# Patient Record
Sex: Male | Born: 1960 | Race: White | Hispanic: No | Smoking: Never smoker
Health system: Southern US, Community
[De-identification: ages and names within clinical notes are randomized; demographics above are authoritative.]

---

## 1997-09-23 ENCOUNTER — Ambulatory Visit (HOSPITAL_COMMUNITY)
Admission: EM | Admit: 1997-09-23 | Discharge: 1997-09-23 | Payer: Self-pay | Admitting: Physical Medicine & Rehabilitation

## 1997-10-14 ENCOUNTER — Ambulatory Visit (HOSPITAL_COMMUNITY)
Admission: RE | Admit: 1997-10-14 | Discharge: 1997-10-14 | Payer: Self-pay | Admitting: Physical Medicine & Rehabilitation

## 1997-10-28 ENCOUNTER — Ambulatory Visit (HOSPITAL_COMMUNITY)
Admission: RE | Admit: 1997-10-28 | Discharge: 1997-10-28 | Payer: Self-pay | Admitting: Physical Medicine & Rehabilitation

## 2003-01-21 ENCOUNTER — Encounter: Payer: Self-pay | Admitting: Specialist

## 2003-01-23 ENCOUNTER — Encounter: Payer: Self-pay | Admitting: Specialist

## 2003-01-23 ENCOUNTER — Ambulatory Visit (HOSPITAL_COMMUNITY): Admission: RE | Admit: 2003-01-23 | Discharge: 2003-01-24 | Payer: Self-pay | Admitting: Specialist

## 2006-02-09 ENCOUNTER — Inpatient Hospital Stay (HOSPITAL_COMMUNITY): Admission: RE | Admit: 2006-02-09 | Discharge: 2006-02-12 | Payer: Self-pay | Admitting: Neurosurgery

## 2009-01-22 ENCOUNTER — Observation Stay (HOSPITAL_COMMUNITY): Admission: EM | Admit: 2009-01-22 | Discharge: 2009-01-23 | Payer: Self-pay | Admitting: Emergency Medicine

## 2010-06-21 ENCOUNTER — Observation Stay (HOSPITAL_COMMUNITY)
Admission: EM | Admit: 2010-06-21 | Discharge: 2010-06-22 | Payer: Self-pay | Source: Home / Self Care | Attending: Internal Medicine | Admitting: Internal Medicine

## 2010-08-30 LAB — LIPID PANEL
Triglycerides: 69 mg/dL (ref <150)
VLDL: 14 mg/dL (ref 0–40)

## 2010-08-30 LAB — CK TOTAL AND CKMB (NOT AT ARMC)
Relative Index: 1.8 (ref 0.0–2.5)
Total CK: 155 U/L (ref 7–232)

## 2010-08-30 LAB — CARDIAC PANEL(CRET KIN+CKTOT+MB+TROPI)
CK, MB: 2 ng/mL (ref 0.3–4.0)
Relative Index: INVALID (ref 0.0–2.5)
Total CK: 109 U/L (ref 7–232)
Troponin I: 0.02 ng/mL (ref 0.00–0.06)

## 2010-08-30 LAB — BASIC METABOLIC PANEL
CO2: 25 mEq/L (ref 19–32)
Calcium: 9 mg/dL (ref 8.4–10.5)
Creatinine, Ser: 0.78 mg/dL (ref 0.4–1.5)
Glucose, Bld: 103 mg/dL — ABNORMAL HIGH (ref 70–99)

## 2010-08-30 LAB — POCT I-STAT, CHEM 8
BUN: 15 mg/dL (ref 6–23)
Calcium, Ion: 1.07 mmol/L — ABNORMAL LOW (ref 1.12–1.32)
Chloride: 103 mEq/L (ref 96–112)
Glucose, Bld: 122 mg/dL — ABNORMAL HIGH (ref 70–99)

## 2010-08-30 LAB — TROPONIN I: Troponin I: 0.02 ng/mL (ref 0.00–0.06)

## 2010-08-30 LAB — POCT CARDIAC MARKERS: Troponin i, poc: 0.09 ng/mL (ref 0.00–0.09)

## 2010-11-05 NOTE — Discharge Summary (Signed)
NAME:  Troy Herrera, Troy Herrera            ACCOUNT NO.:  [id number]   MEDICAL RECORD NO.:  [id number]          PATIENT TYPE:  INP   LOCATION:  3033                         FACILITY:  [facility]   PHYSICIAN:  Reinaldo Meeker, M.D. DATE OF BIRTH:  01/22/1961   DATE OF ADMISSION:  02/09/2006  DATE OF DISCHARGE:  02/12/2006                               DISCHARGE SUMMARY   PRIMARY DIAGNOSIS:  1. Degenerative disk disease.  2. Herniated disk, L4-5, L5-S1.   PRIMARY OPERATIVE PROCEDURE:  L4-5, L5-S1 PLIF with pedicle screw  fixation.   HISTORY:  Mr. Aikey is a 50 year old gentleman with back and  bilateral lower extremity pain.  MRI scan and diskogram showed marked  degenerative disk disease with herniated disk at L4-5 and L5-S1.  He is  now admitted for a 2-level PLIF with screw fixation.  He underwent the  above-mentioned procedure on August 23rd and tolerated well.  By August  24th, he was slowly increasing his activity.  It was hoped that he might  be able to go home on August 25th, but he was having some spasms and  needed some medication.  By the next day, he was feeling better.  He was  discharged home.   DISCHARGE MEDICATIONS:  Include pain medication, muscle relaxants.   His condition was markedly improved versus admission.           ______________________________  Reinaldo Meeker, M.D.     ROK/MEDQ  D:  05/25/2006  T:  05/26/2006  Job:  [id number]

## 2010-11-05 NOTE — Op Note (Signed)
NAME:  Troy Herrera, Troy Herrera            ACCOUNT NO.:  [id number]   MEDICAL RECORD NO.:  [id number]          PATIENT TYPE:  INP   LOCATION:  3033                         FACILITY:  [facility]   PHYSICIAN:  Reinaldo Meeker, M.D. DATE OF BIRTH:  May 18, 1961   DATE OF PROCEDURE:  02/09/2006  DATE OF DISCHARGE:                                 OPERATIVE REPORT   PREOPERATIVE DIAGNOSIS:  Herniated disk and degenerative disk disease of L4-  5, L5-S1.   POSTOPERATIVE DIAGNOSIS:  Herniated disk and degenerative disk disease of L4-  5, L5-S1.   PROCEDURE:  L4-5, L5-S1 decompressive laminectomy followed by bilateral L4-5  and L5-S1 microdiskectomy, followed by L4-5 and L5-S1 posterior lumbar  interbody fusion with NuVasive bony spacers and PEEK interbody cage at each  level, followed by segmental pedicle screw instrumentation, L4-5, L5-S1,  followed by L4-5 and L5-S1 posterolateral fusion.   Secondary procedure:  Microdissection L4-5 and L5-S1 disk as well as L4, L5,  and S1 nerve roots.   SURGEON:  Reinaldo Meeker, M.D.   ASSISTANT:  Dr. Marikay Alar.   PROCEDURE IN DETAIL:  After being placed in prone position, the patient's  back was prepped and draped in the usual sterile fashion.  Localized x-rays  taken prior to incision to identify the appropriate level.  Previous lumbar  incision was opened and extended in both directions to expose the spinous  processes of L3, L4, L5 and S1.  Using Bovie cautery current, the incision  was carried out at spinous processes.  Subperiosteal dissection was then  carried out bilaterally on the spinous processes, lamina, facet joint in the  far lateral region to expose the transverse processes of L4, L5 and the far  lateral aspect of the sacrum bilaterally.  Self-retaining retractor was  placed for exposure and x-rays showed approach at the appropriate levels.  The spinous processes of L4, L5 and S1 were removed, the bones saved for use  later in the case.   Generous laminectomy was then performed bilaterally at  L4-5 and L5-S1 and the far lateral region was also decompressed by removing  the inferior three-quarters of the facet joint bilaterally at the L4-5 and  L5-S1.  At this time L5 and S1 nerve roots bilaterally were tracked up to  foramen.  All the bone that was removed was saved for use later in the case.   At this time, bilateral microdiskectomy was carried out.  Disk space was  incised bilaterally at L4-5 and L5-S1 and thoroughly cleaned out with  pituitary rongeurs and curettes.  At this time dissection was carried out  for any evidence of residual compression and none could be identified.  Large amounts of irrigation were carried out.  A 10-mm distractor was then  placed at L4-5 and this was found to be a good fit.  Posterior lumbar  interbody fusion was then performed.  Rotating scrape was carried out  followed by 10 x 9 mm box chisel cutting.  Initially a bony spacer was  placed on one side.  The opposite side was then prepared and a PEEK  interbody cage filled  with autologous bone graft and Grafton putty was  placed.  Prior to placing the PEEK cage, autologous bone graft and BMP were  placed in the midline.  A similar procedure was then carried out at L5-S1.  Once again, distracting disk space up to a 10-mm height using the rotating  cutter followed by chiseling and placing initially a bony spacer.  Once  again PEEK interbody cage was placed second.  Once again, prior to placing  the PEEK interbody cage, autologous bone graft and BMP were placed at the  midline.  At this time fluoroscopy showed the interbody spacers to be in  good position.  Pedicle screw instrumentation was then carried out  bilaterally.  Under fluoroscopic guidance, a small drill hole entry point  was placed followed by passing of a pedicle awl and tapping with a 6 mm tap.  Screws were then placed with a 50-mm screw at L4, a 45-mm screw at L5, and a  40-mm  screw at S1 bilaterally.  These were found to be in good position by  AP and lateral fluoroscopy.   At this time prior to securing the rod, a posterolateral fusion was  performed.  Transverse processes and lateral aspect of the facet joint were  then decorticated and autologous bone graft and BMP placed in that region  for the posterolateral fusion bilaterally.  Appropriate length rod was then  chosen. Top-loading screws were then secured.  These were then sequentially  tightened down to their final tightening with sequential compression being  carried out.  Cross link was then placed and secured in standard fashion.  Final fluoroscopy and AP and lateral direction showed the screws, rods,  cross link, interbody spacers, all being in good position.  Large amounts of  irrigation carried out at this time and any bleeding controlled by  coagulation Gelfoam.  A Hemovac drain was placed in the epidural space and  bought out through a separate stab wound incision.   The wound was then closed in multiple layers of Vicryl in the muscle and  fascia, subcutaneous and epidural tissues.  Staples were placed on the skin.  A sterile dressing was then applied and the patient was extubated, taken to  the recovery room in stable condition.           ______________________________  Reinaldo Meeker, M.D.     ROK/MEDQ  D:  02/09/2006  T:  02/10/2006  Job:  [id number]

## 2010-11-05 NOTE — Op Note (Signed)
NAME:  Troy Herrera, Troy Herrera                      ACCOUNT NO.:  [id number]   MEDICAL RECORD NO.:  [id number]                   PATIENT TYPE:  OIB   LOCATION:  NA                                   FACILITY:  [facility]   PHYSICIAN:  Kerrin Champagne, M.D.                DATE OF BIRTH:  1961-05-17   DATE OF PROCEDURE:  01/23/2003  DATE OF DISCHARGE:                                 OPERATIVE REPORT   PREOPERATIVE DIAGNOSIS:  Central and left-sided herniated nucleus pulposus,  L5-S1.   POSTOPERATIVE DIAGNOSIS:  Central and left-sided herniated nucleus pulposus,  L5-S1.   PROCEDURE:  Bilateral microdiskectomy, L5-S1 (the patient underwent excision  of herniated nucleus pulposus, left side at L5-S1, and exploration of disk  on the right side at L5-S1).   SURGEON:  Kerrin Champagne, M.D.   ASSISTANT:  Wende Neighbors, P.A.   ANESTHESIA:  GOT.   ESTIMATED BLOOD LOSS:  15 mL.   DRAINS:  None.   BRIEF CLINICAL HISTORY:  The patient is a 50 year old male who has been  experiencing back pain with radiation into his left lower extremity greater  than the right lower extremity.  This has been ongoing now for over four  months.  The patient has undergone attempts at conservative management  including a physical therapy program, work conditioning, and work Scientific laboratory technician.  He has had persistent recurring pain into his back and radiation into his  legs that occur with prolonged sitting and driving.  His job profession is  as a Naval architect.  After undergoing a conservative program of work  conditioning he was returned to his usual job duties after functional  capacity evaluation demonstrated the capacity to perform this work.  After  only a three of four-day period of driving, the patient had worsening pain  in his back with radiation to the left leg greater than the right.  Clinical  exam was consistent with neural tension signs recurring here, follow-up MRI  study demonstrating worsening of the disk  protrusion at the L5-S1 level  previously felt to be a small disk protrusion with possible nerve  compression.  The patient is brought to the operating room to undergo  microdiskectomy, L5-S1, as the disk protrusion is central and the patient  does experience some right buttock and thigh pain.  A bilateral approach  will be performed with excision of the disk on the left side and exploration  of the right side to ensure that all disk herniation had been adequately  decompressed.   INTRAOPERATIVE FINDINGS:  The patient was found to have a subligamentous  disk herniation at the L5-S1 level with several fragments of disk material  located beneath the posterior longitudinal ligament and pressing on the  central portion of the thecal sac and left side.  After decompression on the  left side and careful removal of the disk including attempts at excising the  central portion  of the disk and right-sided portion, exploration of the  right side demonstrated that the disk had been completely decompressed by  the left-side microdiskectomy.  Therefore, further disk excision was not  performed on the right side with exploration.   DESCRIPTION OF PROCEDURE:  After adequate general anesthesia with the  patient in knee-chest position on the Andrews frame, standard preoperative  antibiotics of Ancef, standard prep with Duraprep solution, draped in the  usual manner, iodine Vi-Drape was used.  Incision made approximately an inch  to an inch and a half in length extending from the lower end of the previous  incision scar at the L4 level, extended distally through the skin and  subcutaneous layers using a 10 blade scalpel after infiltration of Marcaine  0.5% and 1:200,000 epinephrine.  Incision carried down to the spinous  process of L5 and S1.  Incised on both sides of the attachment of the  lumbodorsal fascia here.  A Kocher clamp then placed on the spinous process  of L5 and S1 and an intraoperative  lateral radiograph demonstrated these to  be the marked spinous processes as noted, L5 and S1.  Two Cobbs were then  used to elevate the paralumbar muscles off the posterior aspect of the  lamina of L5 over the interlaminar space posteriorly at L5-S1 and over the  posterior aspect of the upper aspect of S1 lamina.  This was done  bilaterally.  Bleeders controlled using bipolar electrocautery.  A  McCullough retractor first inserted on the left side.  A small amount of the  inferior aspect of the lamina of L5 was resected using a Leksell rongeur and  then a 3 mm Kerrison used to free the insertion of ligamentum flavum into  the inferior anterior aspect of the L5 lamina.  The ligamentum flavum was  then thinned posteriorly using a 3 mm Kerrison and then carefully freed up  off of the superior aspect of the S1 lamina and the canal entered over the  superior aspect of S1.  The ligamentum flavum then debrided out laterally  and resected off of the medial aspect of the facet at the L5-S1 level.  The  entire facet was preserved medially on the left side and right side.  The  ligamentum flavum left intact but freed up to L4, retraction of the thecal  sac and the S1 nerve root.  A hockey stick nerve probe passed against the  medial aspect of the S1 pedicle.  This was identified and the S1 nerve root  found to be exiting freely out the S1 foramen.  The thecal sac and the S1  nerve root then retracted medially and the disk at the L5-S1 level  identified, found to be protruding quite prominently centrally.  This was  impressing on the S1 nerve root and on the lateral aspect of the thecal sac  on the left side.  With the thecal sac and S1 nerve root retracted, then  bleeders were controlled using bipolar electrocautery, cottonoids, Gelfoam  then used for hemostasis.  A 15 blade scalpel was used to incise the disk after first bringing into the field the operating room microscope.  The  previous portions  of the procedure were performed using loupe magnification  as well as a head lamp.  Under the operating room microscope the incision  was made into the posterior aspect of the disk, protecting the thecal sac  and S1 nerve root with D'Errico and cottonoids.  A sagittal incision was  made into the disk and near the midline and then a nerve hook used to  carefully free up subligamentous disk herniation material that was found to  be present on the anterior ventral aspect of the thecal sac.  This disk  material was carefully freed up and then excised using pituitary rongeurs.  An Epstein curette was used to further remove the disk material that was  anterior to the posterior longitudinal ligament that was herniated  posteriorly, including some portions of the annulus fibrosus that were  hinged posteriorly, requiring a resection using both straight and upbiting  pituitary rongeurs.  The disk space itself was debrided of loose,  degenerated disk material from the left side.  A hockey stick nerve probe  could be passed across the midline and centrally, used to press up on the  herniated disk area and beneath the posterior longitudinal ligament to  further free up the disk material present here.  Then it was removed using  pituitary rongeurs both upbiting and straight.  Following this, irrigation  was performed.  Small bleeders controlled using bipolar electrocautery.  The  S1 nerve root had normal appearance.  The right side laminotomy site was  then carefully exposed.  This had been done previously as well.  In fact,  bilateral openings into the ligamentum flavum had already been performed  prior to entry of the scope and decompression of the left side.  A small  portion of the inferior aspect of the lamina of L5 was resected on the right  side prior to the decompression on the left side, then the ligamentum flavum  carefully freed off the inferior anterior aspect of the L5 lamina over the   superior aspect of S1 but debrided off of the medial aspect of the facet on  the right side at L5-S1.  The entire facet preserved.  A hockey stick nerve  probe could be passed out the S1 neural foramen without difficulty both  posterior and anterior to the nerve root, demonstrating that this was freely  exiting.  The nerve probe could then be passed out the L5 neural foramen,  then over the posterior aspect of the disk on the right side once the thecal  sac was retracted using a D'Errico along with the S1 nerve root.  Some  bleeding from epidural veins was encountered.  This was controlled using  bipolar electrocautery as well as Gelfoam, thrombin-soaked, with cottonoids.  The hockey stick nerve probe passed over the posterior aspect of the disk,  demonstrating the disk had been completely decompressed from the left side.  The posterior ridge of the posterior inferior aspect of the L5 vertebral body was felt to be present, but no soft or herniated disk material felt to  be present over the posterior aspect of the disk space centrally or to the  right side.  With this, then irrigation was performed.  Thrombin-soaked  Gelfoam placed as well as cottonoids.  After a period of three or four  minutes, then Gelfoam was removed from the laminotomy area.  All the Gelfoam  was completely removed, and then inspection demonstrated no active bleeding  evident.  A small portion of Gelfoam then placed over the laminotomy defect  posteriorly.  The McCullough retractor was removed, then reinserted on the  left side, and inspection of the disk space demonstrated that there was no  further disk material remaining.  Bleeding had been controlled with previous  Gelfoam.  All the Gelfoam was then removed from  the spinal canal on the left  side.  There was no active bleeding evident.  A small portion of Gelfoam had  been placed over the posterior laminotomy defect on the left side.  Soft  tissues were then allowed  to fall back into place, the lumbar dorsal fascia  reapproximated in the midline with interrupted 0 Vicryl sutures, deep subcu  layers approximated with interrupted 0, then interrupted 2-0 Vicryl sutures,  then the skin closed with a running subcu stitch of 4-0 Vicryl.  Tincture of  Benzoin and Steri-Strips applied, a coverlet dressing applied.  Both  infiltration was performed to the skin and subcu layers, the patient's  lumbodorsal fascia, as well as around the periarticular regions during the  initial entry into the incision site, trying to allow for a local anesthetic  effect.  The patient was then returned to a supine position, reactivated,  extubated, and returned to the recovery room in satisfactory condition.                                               Kerrin Champagne, M.D.    Myra Rude  D:  01/23/2003  T:  01/24/2003  Job:  [id number]

## 2012-04-16 IMAGING — CR DG UGI W/ HIGH DENSITY W/KUB
1 series · 1 of 1 positions shown · non-contrast
Comparison: plain film chest of 1 day prior.

CLINICAL DATA: Mid epigastric pain.  Chest pain.  Rule out
esophagitis or peptic ulcer disease.

UPPER GI SERIES WITH KUB
TECHNIQUE: Routine upper GI series was performed with thin and
thick barium
Fluoroscopy Time: 5.6 minutes

[view not recorded]
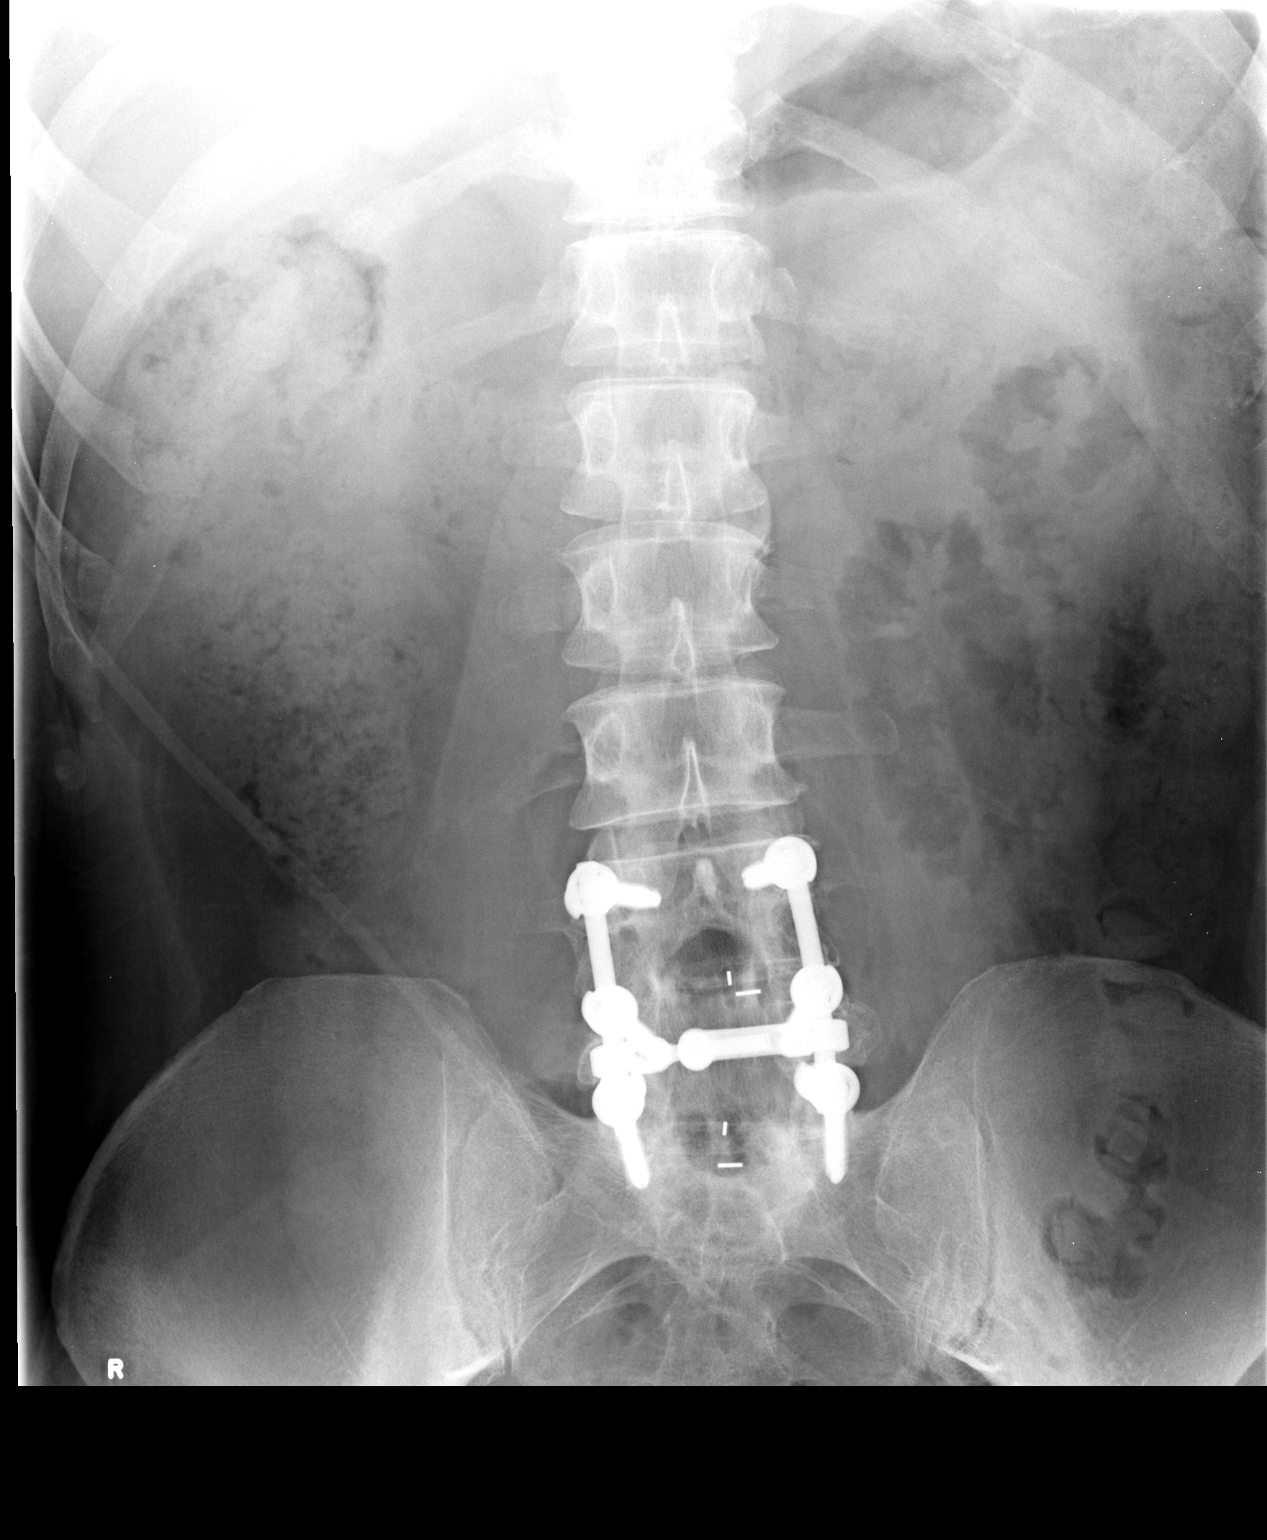

[1 of 1 positions shown; findings below may reference images not displayed]

FINDINGS: Double contrast evaluation of the esophagus demonstrates
no mucosal abnormality.

Double contrast evaluation of the stomach demonstrates no evidence
of mass, ulcer, or fold thickening.  The duodenal bulb and proximal
descending duodenum are within normal limits.

Evaluation of primary peristalsis demonstrates a normal primary
peristaltic wave on each of three swallows.  A small hiatal hernia
is noted.

Full column evaluation of the esophagus demonstrates no areas of
stricture or persistent narrowing.

Pre procedure scout film demonstrates a nonobstructive bowel gas
pattern.  Lower lumbar spine fixation. No abnormal abdominal
calcifications.   No appendicolith.
IMPRESSION: 1.  Tiny hiatal hernia.
2.  Otherwise, normal upper GI.

## 2015-05-23 ENCOUNTER — Encounter (HOSPITAL_BASED_OUTPATIENT_CLINIC_OR_DEPARTMENT_OTHER): Payer: Self-pay | Admitting: Emergency Medicine

## 2015-05-23 ENCOUNTER — Emergency Department (HOSPITAL_BASED_OUTPATIENT_CLINIC_OR_DEPARTMENT_OTHER)
Admission: EM | Admit: 2015-05-23 | Discharge: 2015-05-23 | Disposition: A | Discharge status: Home / Self Care | Payer: Worker's Compensation | Attending: Emergency Medicine | Admitting: Emergency Medicine

## 2015-05-23 ENCOUNTER — Emergency Department (HOSPITAL_BASED_OUTPATIENT_CLINIC_OR_DEPARTMENT_OTHER): Payer: Worker's Compensation

## 2015-05-23 DIAGNOSIS — S8992XA Unspecified injury of left lower leg, initial encounter: Secondary | ICD-10-CM

## 2015-05-23 DIAGNOSIS — Y9389 Activity, other specified: Secondary | ICD-10-CM | POA: Insufficient documentation

## 2015-05-23 DIAGNOSIS — Z7982 Long term (current) use of aspirin: Secondary | ICD-10-CM | POA: Insufficient documentation

## 2015-05-23 DIAGNOSIS — Y9289 Other specified places as the place of occurrence of the external cause: Secondary | ICD-10-CM | POA: Insufficient documentation

## 2015-05-23 DIAGNOSIS — Y990 Civilian activity done for income or pay: Secondary | ICD-10-CM | POA: Diagnosis not present

## 2015-05-23 DIAGNOSIS — R52 Pain, unspecified: Secondary | ICD-10-CM

## 2015-05-23 DIAGNOSIS — W231XXA Caught, crushed, jammed, or pinched between stationary objects, initial encounter: Secondary | ICD-10-CM | POA: Diagnosis not present

## 2015-05-23 DIAGNOSIS — S8012XA Contusion of left lower leg, initial encounter: Secondary | ICD-10-CM | POA: Diagnosis not present
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
## 2015-05-23 NOTE — Discharge Instructions (Signed)

## 2015-05-23 NOTE — ED Notes (Signed)
Ecchymotic area noted at Left ankle and is tender on palpation

## 2015-05-23 NOTE — ED Notes (Signed)
Rash noted on both Rt and Lt Lower extremities, pt states is dry skin and itches alot

## 2015-05-23 NOTE — ED Notes (Signed)
Pt reports injury to left leg while out of town working, developed bruising to calf, drove 12 hours home and now has significant swelling to leg

## 2015-05-23 NOTE — ED Notes (Signed)
sm type "knot" easily palpated on Left Lower Extremity, approx dime size, area marked

## 2015-05-23 NOTE — ED Provider Notes (Signed)
CSN: [id number]     Arrival date & time 05/23/15  1342 History   First MD Initiated Contact with Patient 05/23/15 1503     Chief Complaint  Patient presents with  . Leg Pain     (Consider location/radiation/quality/duration/timing/severity/associated sxs/prior Treatment) HPI   Troy Herrera is a 54 y.o. male  PCP: No primary care provider on file.  Blood pressure 127/89, pulse 90, temperature 98.1 F (36.7 C), temperature source Oral, resp. rate 18, height 6\' 3"  (1.905 m), weight 111.131 kg, SpO2 98 %.  SIGNIFICANT PMH:  denies CHIEF COMPLAINT: left leg pain, swelling and bruising  When: 3 weeks ago the patient injured his leg getting it smashed between a tall object and the ladder. He has had some pain and swelling since the incident but the leg continues to be swollen. Mild pain. Some associated bruising Chronicity: acute Location: left calf Radiation: down to his left ankle Quality and severity: pressure and swelling Treatments tried: time otherwise none Alleviating factors: rest and elevation Worsening factors: keeping his foot down and standing for prolonged periods Associated Symptoms: none Risk Factors: immobile for long periods of time  Negative ROS: Confusion, diaphoresis, fever, headache, weakness (general or focal), change of vision,  neck pain, dysphagia, aphagia, chest pain, shortness of breath,  back pain, abdominal pains, nausea, vomiting, diarrhea, rash.     History reviewed. No pertinent past medical history. History reviewed. No pertinent past surgical history. History reviewed. No pertinent family history. Social History  Substance Use Topics  . Smoking status: Never Smoker   . Smokeless tobacco: None  . Alcohol Use: No    Review of Systems  All other systems reviewed and are negative.     Allergies  Review of patient's allergies indicates no known allergies.  Home Medications   Prior to Admission medications   Medication Sig Start  Date End Date Taking? Authorizing Provider  aspirin 81 MG tablet Take 81 mg by mouth daily.   Yes Historical Provider, MD   BP 121/69 mmHg  Pulse 77  Temp(Src) 98.1 F (36.7 C) (Oral)  Resp 18  Ht 6\' 3"  (1.905 m)  Wt 111.131 kg  BMI 30.62 kg/m2  SpO2 98% Physical Exam  Constitutional: He appears well-developed and well-nourished. No distress.  HENT:  Head: Normocephalic and atraumatic.  Eyes: Pupils are equal, round, and reactive to light.  Neck: Normal range of motion. Neck supple.  Cardiovascular: Normal rate and regular rhythm.   Pulmonary/Chest: Effort normal and breath sounds normal. He has no decreased breath sounds. He has no wheezes.  Abdominal: Soft.  Musculoskeletal:       Left lower leg: He exhibits tenderness, swelling and edema ( eccymosis). He exhibits no bony tenderness, no deformity and no laceration.  NIV, CR < 3 seconds to left foot/toes. No swelling to right leg  Neurological: He is alert.  Skin: Skin is warm and dry.  Nursing note and vitals reviewed.   ED Course  Procedures (including critical care time) Labs Review Labs Reviewed - No data to display  Imaging Review [place] Venous Img Lower Unilateral Left  05/23/2015  CLINICAL DATA:  Left ankle pain and edema. EXAM: LEFT LOWER EXTREMITY VENOUS DOPPLER ULTRASOUND TECHNIQUE: Gray-scale sonography with graded compression, as well as color Doppler and duplex ultrasound were performed to evaluate the lower extremity deep venous systems from the level of the common femoral vein and including the common femoral, femoral, profunda femoral, popliteal and calf veins including the posterior tibial, peroneal and  gastrocnemius veins when visible. The superficial great saphenous vein was also interrogated. Spectral Doppler was utilized to evaluate flow at rest and with distal augmentation maneuvers in the common femoral, femoral and popliteal veins. COMPARISON:  None. FINDINGS: Contralateral Common Femoral Vein: Respiratory  phasicity is normal and symmetric with the symptomatic side. No evidence of thrombus. Normal compressibility. Common Femoral Vein: No evidence of thrombus. Normal compressibility, respiratory phasicity and response to augmentation. Saphenofemoral Junction: No evidence of thrombus. Normal compressibility and flow on color Doppler imaging. Profunda Femoral Vein: No evidence of thrombus. Normal compressibility and flow on color Doppler imaging. Femoral Vein: No evidence of thrombus. Normal compressibility, respiratory phasicity and response to augmentation. Popliteal Vein: No evidence of thrombus. Normal compressibility, respiratory phasicity and response to augmentation. Calf Veins: No evidence of thrombus. Normal compressibility and flow on color Doppler imaging. Superficial Great Saphenous Vein: No evidence of thrombus. Normal compressibility and flow on color Doppler imaging. Venous Reflux:  None. Other Findings: No evidence of superficial thrombophlebitis or abnormal fluid collection. IMPRESSION: No evidence of left lower extremity deep venous thrombosis. Electronically Signed   By: Irish Lack M.D.   On: 05/23/2015 14:49   I have personally reviewed and evaluated these images and lab results as part of my medical decision-making.   EKG Interpretation None      MDM   Final diagnoses:  Lower extremity injury, left, initial encounter    Negative for DVT. RICE and referral to Ortho, pt may need MRI for ongoing symptoms and possible muscle injury.  No CP, SOB. Medications - No data to display   I feel the patient has had an appropriate workup for their chief complaint at this time and likelihood of emergent condition existing is low. Discussed s/sx that warrant return to the ED.  Filed Vitals:   05/23/15 1345 05/23/15 1548  BP: 127/89 121/69  Pulse: 90 77  Temp: 98.1 F (36.7 C)   Resp: 18 [id number] PA-C 05/24/15 0032  Marily Memos, MD 05/24/15 [phone]

## 2015-05-23 NOTE — ED Notes (Signed)
Denies any chest pain or dyspnea on extertion

## 2016-09-30 IMAGING — US US EXTREM LOW VENOUS*L*
1 series · 13 of 24 positions shown · non-contrast
Comparison: None.

CLINICAL DATA: Left ankle pain and edema.



[Series 1: us extrem low venous*left* · 0.08mm/px · 13 of 40 slices shown]
[im 1/40]
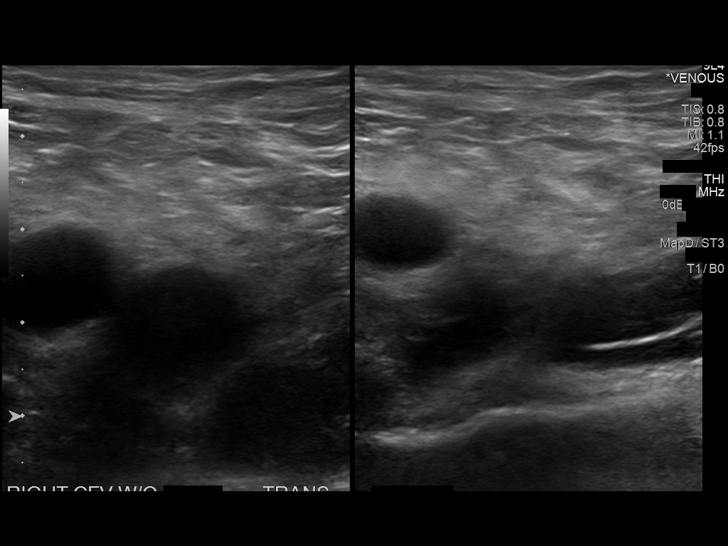
[im 4/40]
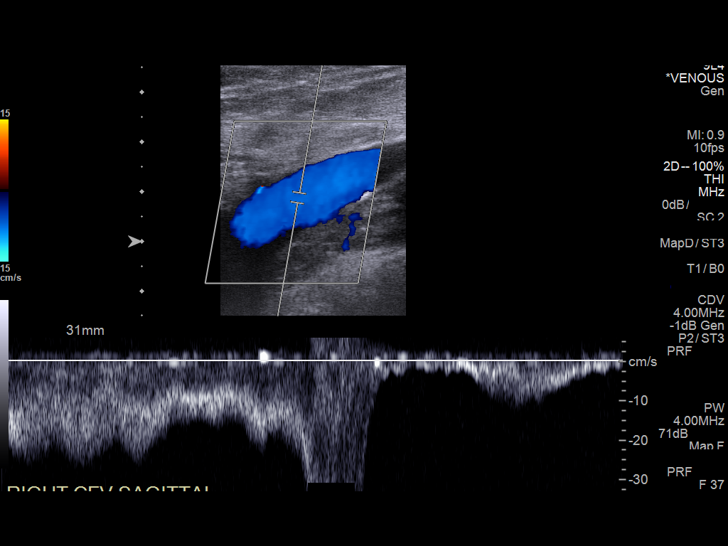
[im 7/40]
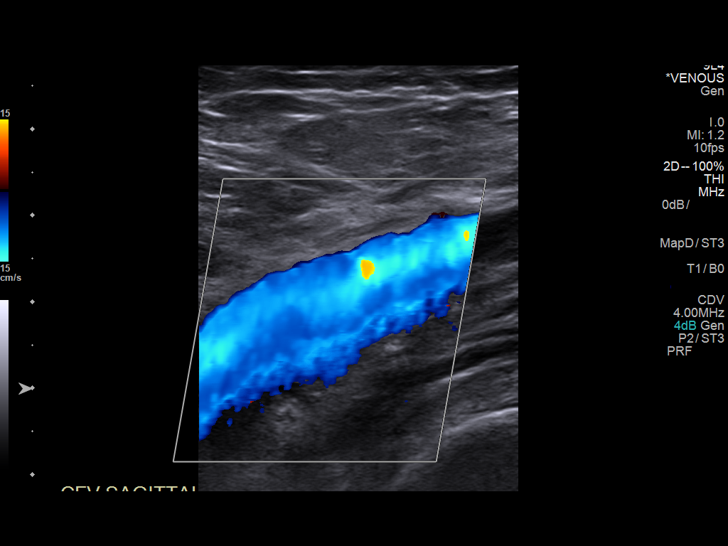
[im 11/40]
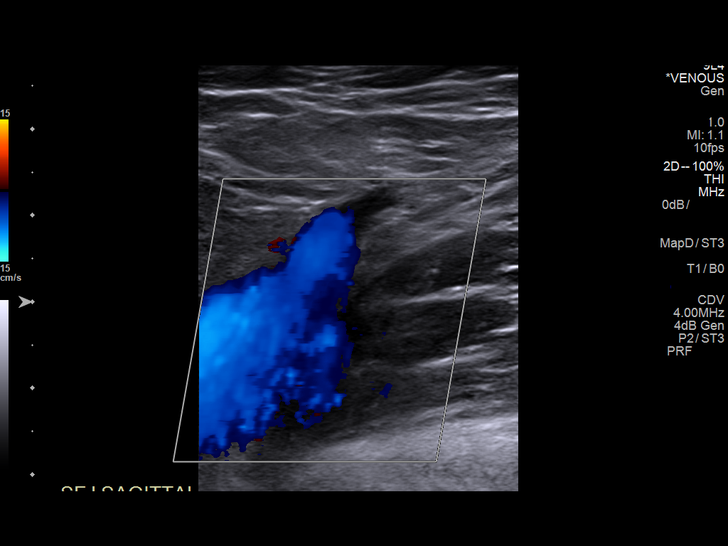
[im 14/40]
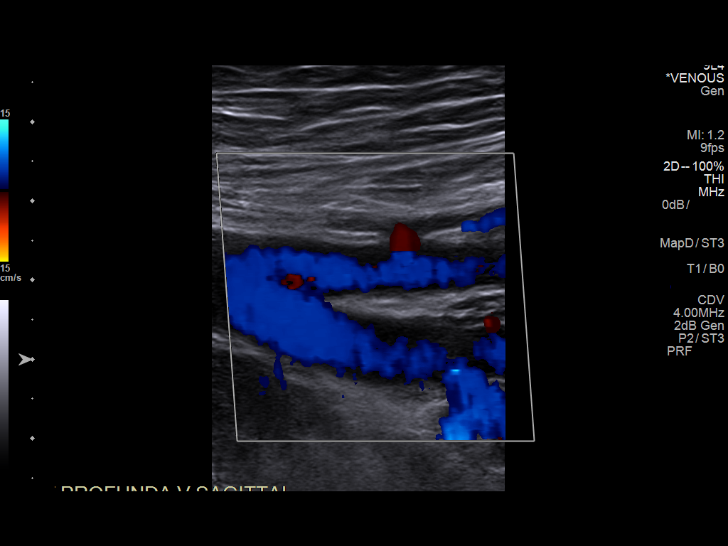
[im 17/40]
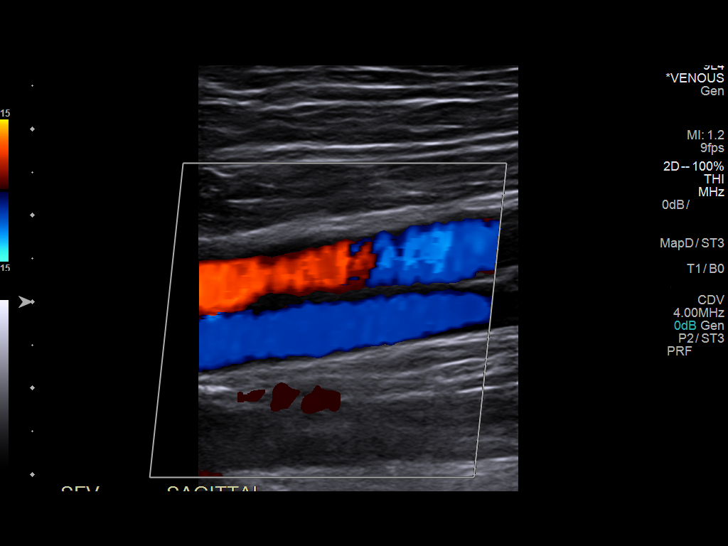
[im 21/40]
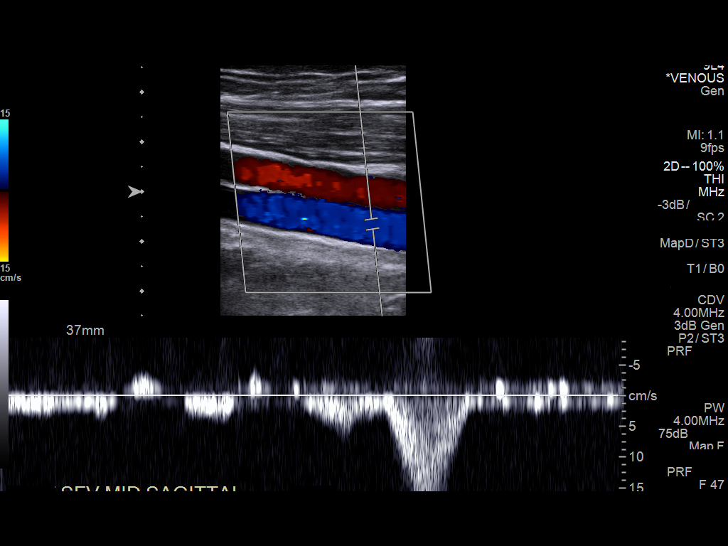
[im 23/40]
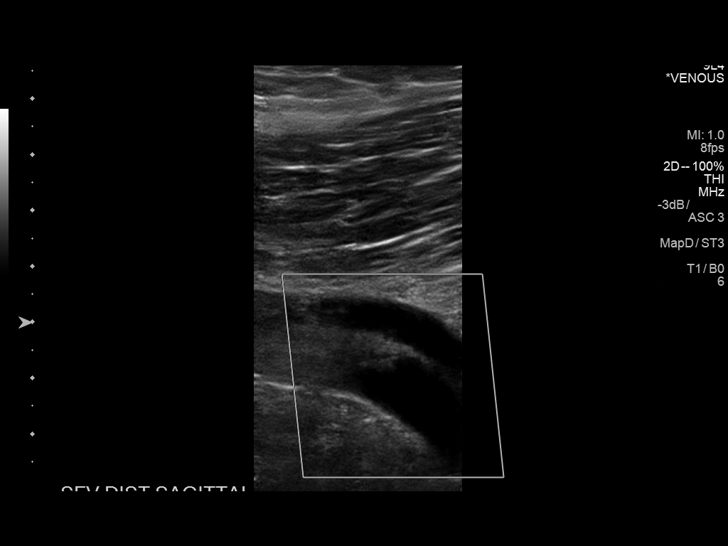
[im 26/40]
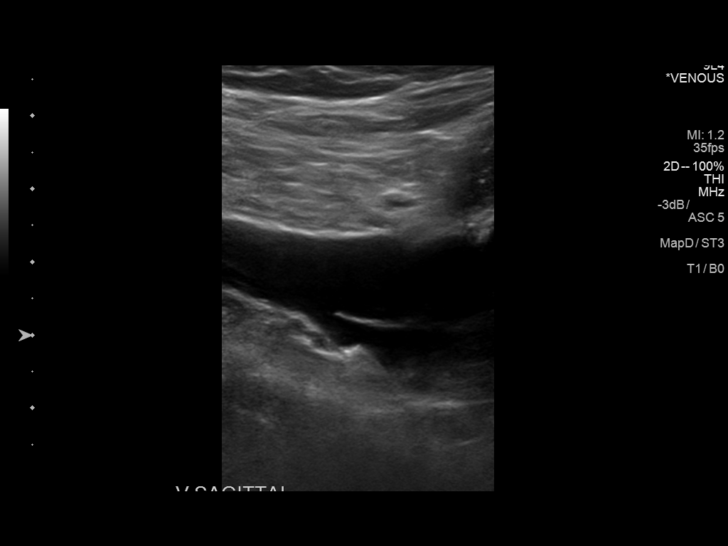
[im 29/40]
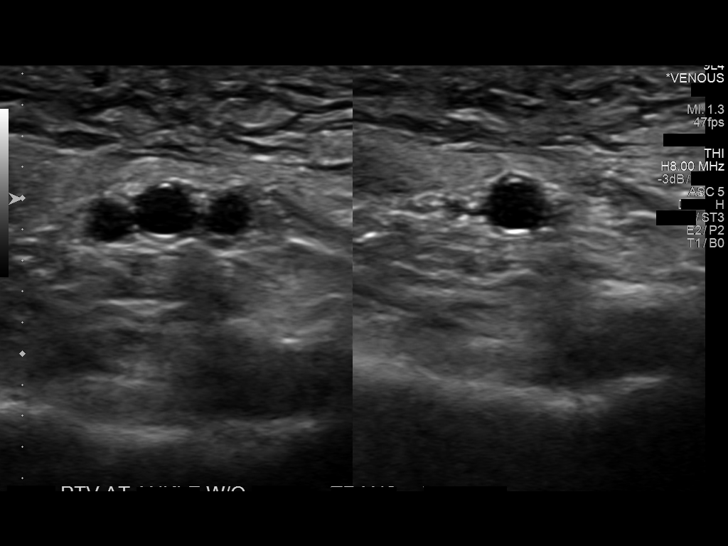
[im 33/40]
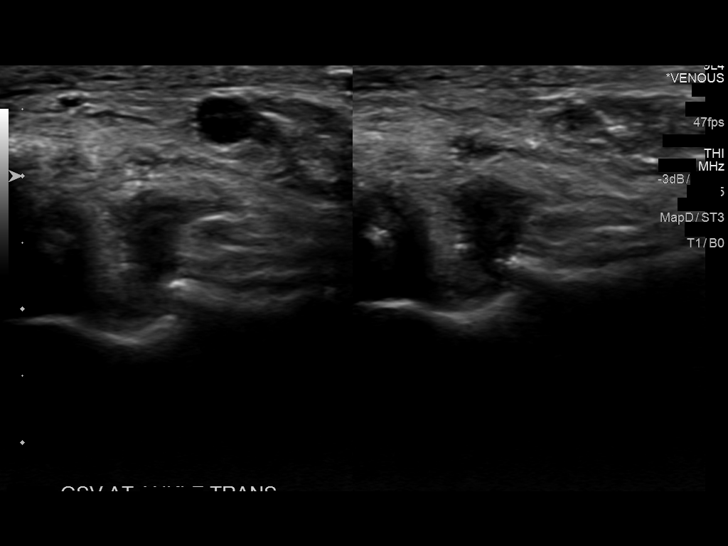
[im 36/40]
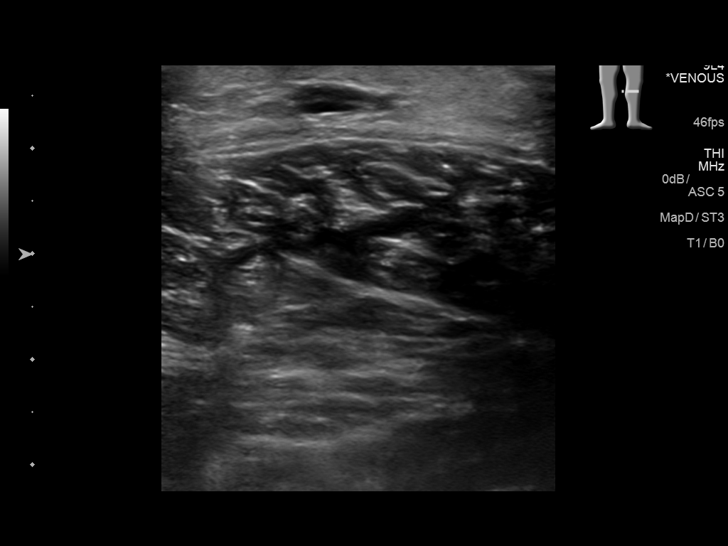
[im 40/40]
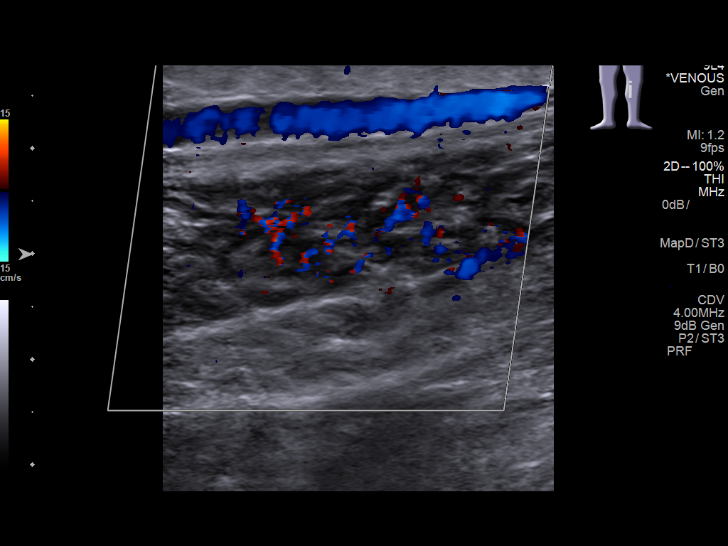

[13 of 24 positions shown; findings below may reference images not displayed]

FINDINGS: Contralateral Common Femoral Vein: Respiratory phasicity is normal
and symmetric with the symptomatic side. No evidence of thrombus.
Normal compressibility.

Common Femoral Vein: No evidence of thrombus. Normal
compressibility, respiratory phasicity and response to augmentation.

Saphenofemoral Junction: No evidence of thrombus. Normal
compressibility and flow on color Doppler imaging.

Profunda Femoral Vein: No evidence of thrombus. Normal
compressibility and flow on color Doppler imaging.

Femoral Vein: No evidence of thrombus. Normal compressibility,
respiratory phasicity and response to augmentation.

Popliteal Vein: No evidence of thrombus. Normal compressibility,
respiratory phasicity and response to augmentation.

Calf Veins: No evidence of thrombus. Normal compressibility and flow
on color Doppler imaging.

Superficial Great Saphenous Vein: No evidence of thrombus. Normal
compressibility and flow on color Doppler imaging.

Venous Reflux:  None.

Other Findings: No evidence of superficial thrombophlebitis or
abnormal fluid collection.
IMPRESSION: No evidence of left lower extremity deep venous thrombosis.

## 2018-02-26 ENCOUNTER — Other Ambulatory Visit: Payer: Self-pay | Admitting: Orthopedic Surgery

## 2018-04-12 ENCOUNTER — Ambulatory Visit (HOSPITAL_BASED_OUTPATIENT_CLINIC_OR_DEPARTMENT_OTHER): Admit: 2018-04-12 | Payer: Self-pay | Admitting: Orthopedic Surgery

## 2018-04-12 ENCOUNTER — Encounter (HOSPITAL_BASED_OUTPATIENT_CLINIC_OR_DEPARTMENT_OTHER): Payer: Self-pay

## 2018-04-12 SURGERY — CARPAL TUNNEL RELEASE
Anesthesia: Regional | Laterality: Left
